# Patient Record
Sex: Male | Born: 1975 | Race: White | Hispanic: No | Marital: Single
Health system: Southern US, Community
[De-identification: ages and names within clinical notes are randomized; demographics above are authoritative.]

---

## 2008-07-26 ENCOUNTER — Emergency Department (HOSPITAL_COMMUNITY): Admission: EM | Admit: 2008-07-26 | Discharge: 2008-07-27 | Payer: Self-pay | Admitting: Emergency Medicine

## 2009-10-09 IMAGING — CR DG STERNUM 2+V
1 series · 1 of 1 positions shown · non-contrast
Comparison: Chest x-ray on same date

CLINICAL DATA: Lower sternal pain after injury

STERNUM - 2+ VIEW

[w sternum lat]
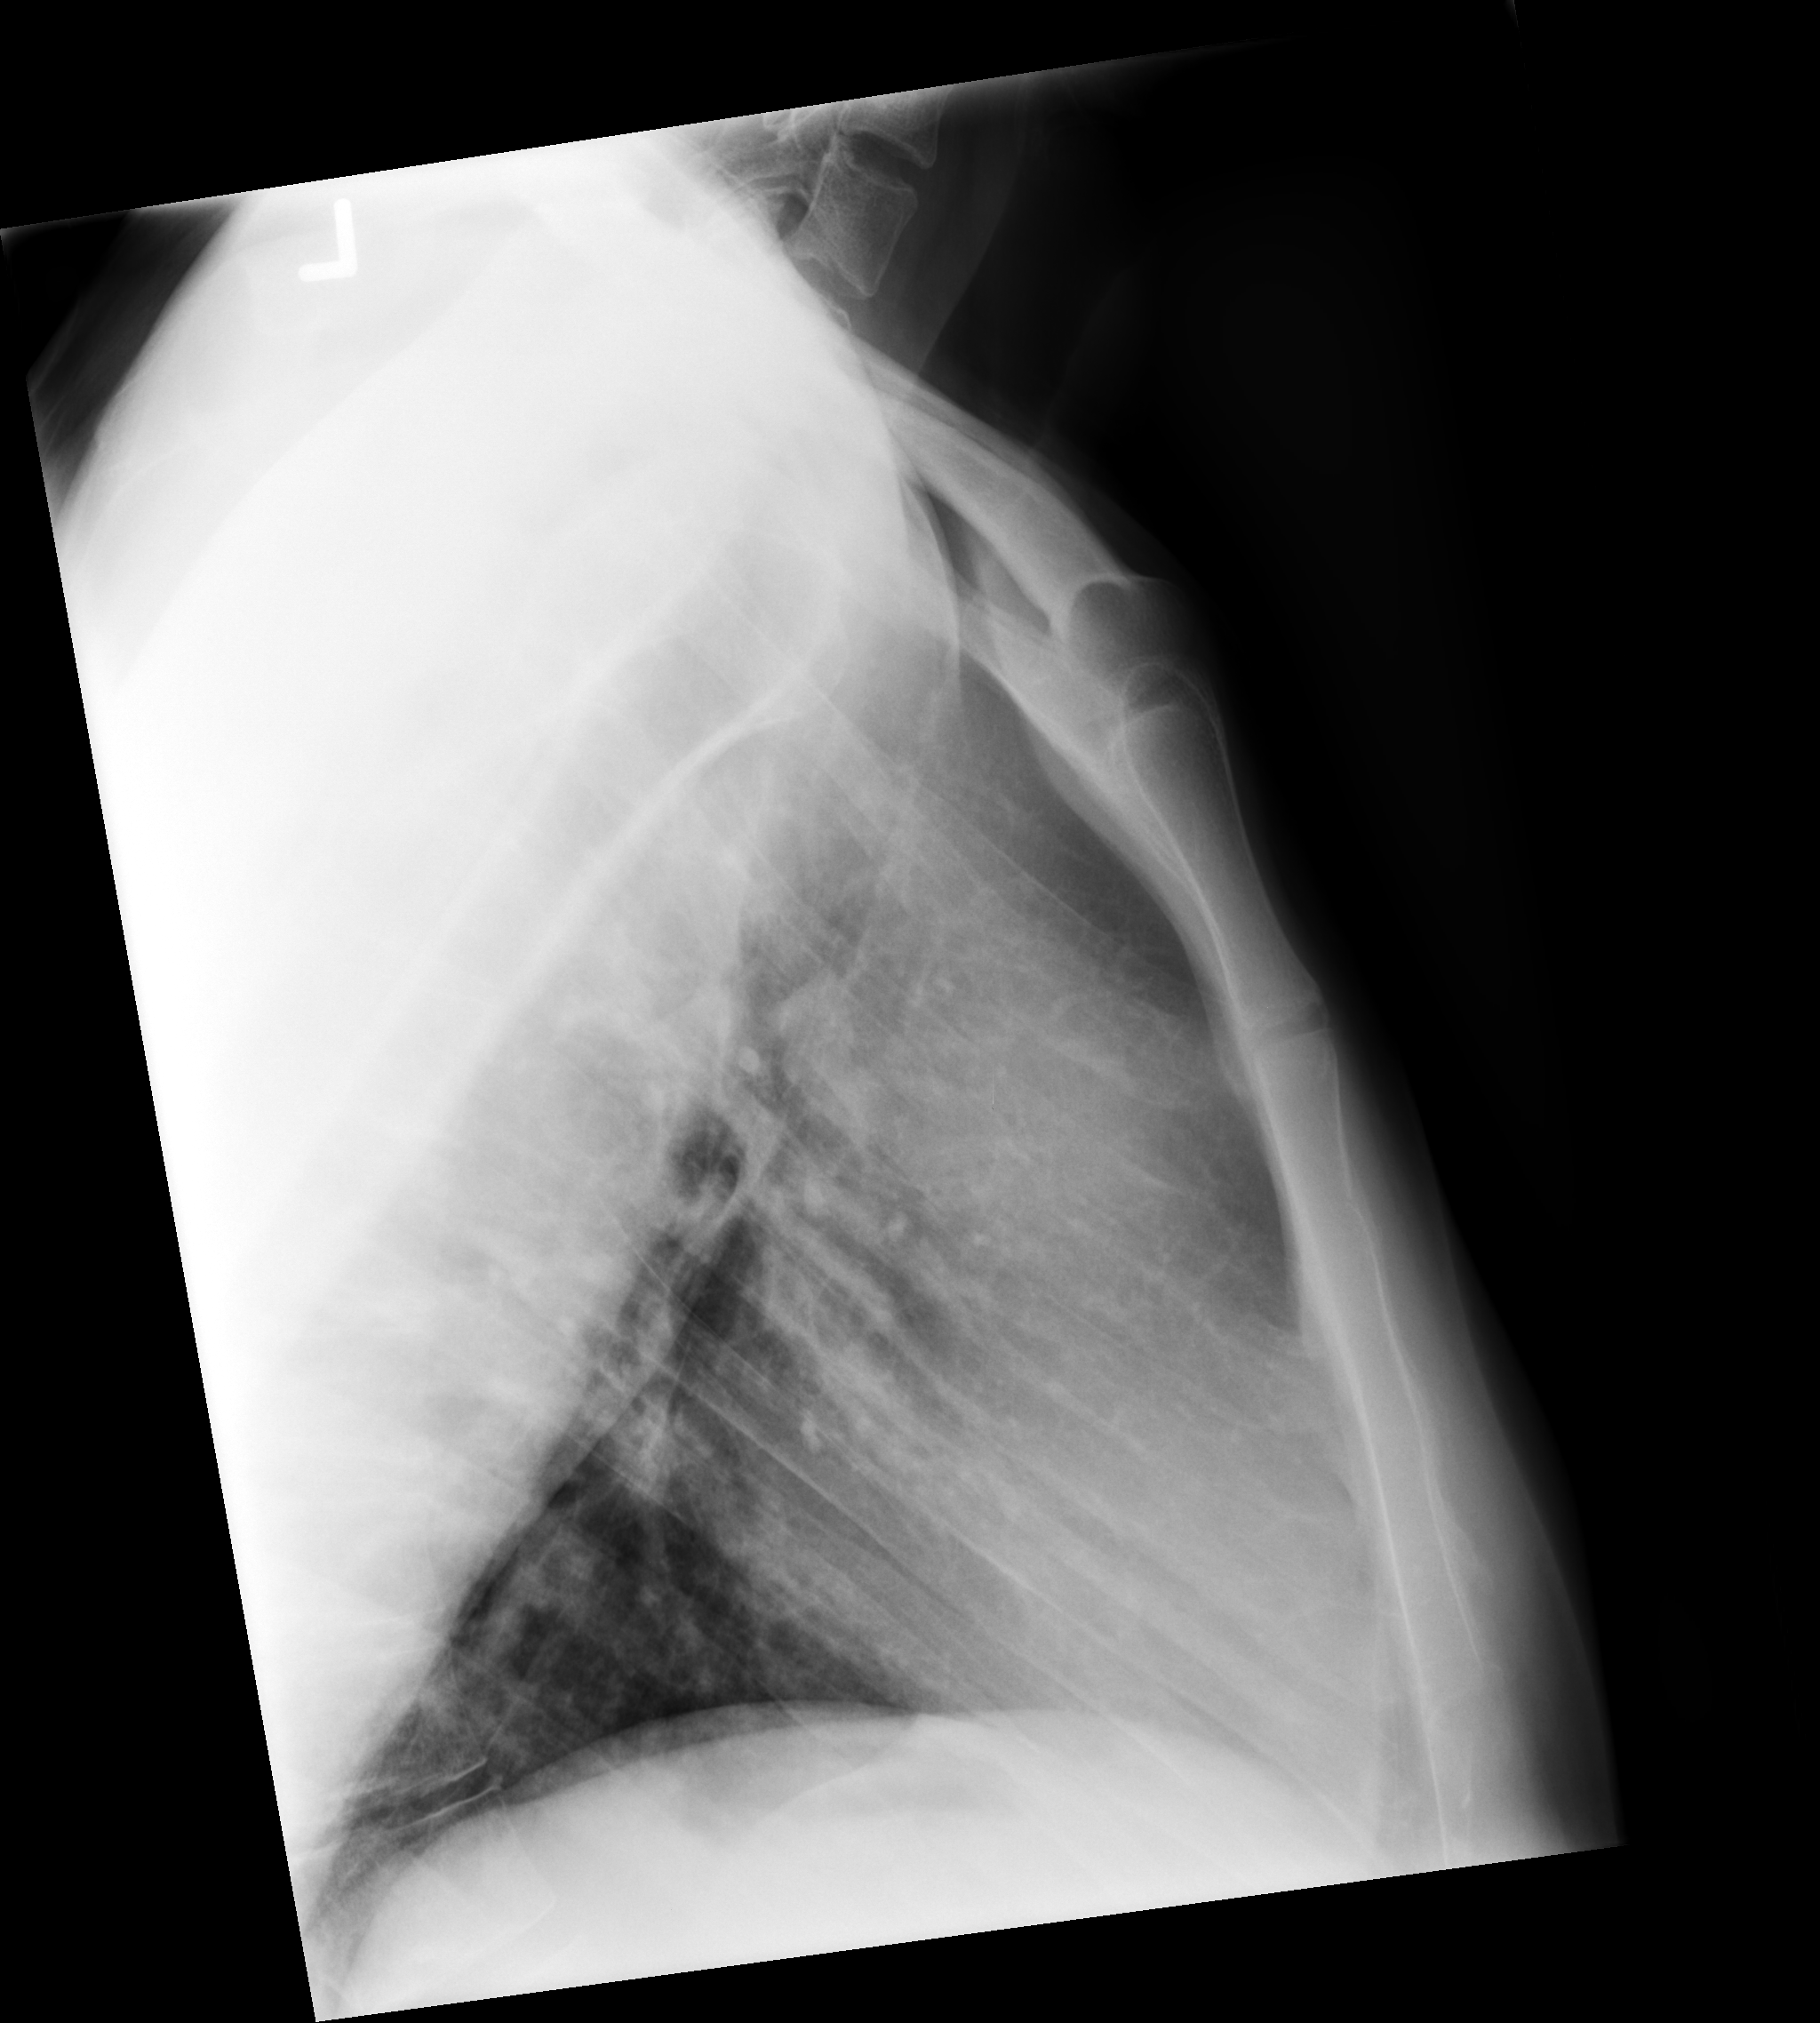

[1 of 1 positions shown; findings below may reference images not displayed]

FINDINGS: There is mild irregularity involving the contour of the
distal sternum and xyphoid.  This does not have a convincing
appearance for acute fracture.  The rest of the sternum and
manubrium are normal in appearance.
IMPRESSION: No convincing sternal fracture identified.  Some irregularity of
the distal sternum present which likely does not represent acute
injury.

## 2016-12-25 ENCOUNTER — Encounter (HOSPITAL_COMMUNITY): Payer: Self-pay | Admitting: Emergency Medicine

## 2016-12-25 ENCOUNTER — Emergency Department (HOSPITAL_COMMUNITY)
Admission: EM | Admit: 2016-12-25 | Discharge: 2016-12-26 | Disposition: A | Discharge status: Home / Self Care | Payer: Self-pay | Attending: Emergency Medicine | Admitting: Emergency Medicine

## 2016-12-25 DIAGNOSIS — R202 Paresthesia of skin: Secondary | ICD-10-CM | POA: Insufficient documentation

## 2016-12-25 DIAGNOSIS — Z79899 Other long term (current) drug therapy: Secondary | ICD-10-CM | POA: Insufficient documentation

## 2016-12-25 LAB — CBC
HCT: 48.2 % (ref 39.0–52.0)
Hemoglobin: 16.8 g/dL (ref 13.0–17.0)
MCH: 30.8 pg (ref 26.0–34.0)
MCHC: 34.9 g/dL (ref 30.0–36.0)
MCV: 88.3 fL (ref 78.0–100.0)
PLATELETS: 272 10*3/uL (ref 150–400)
RBC: 5.46 MIL/uL (ref 4.22–5.81)
RDW: 13.3 % (ref 11.5–15.5)
WBC: 11.1 10*3/uL — ABNORMAL HIGH (ref 4.0–10.5)

## 2016-12-25 LAB — BASIC METABOLIC PANEL
Anion gap: 8 (ref 5–15)
BUN: 15 mg/dL (ref 6–20)
CALCIUM: 10 mg/dL (ref 8.9–10.3)
CO2: 27 mmol/L (ref 22–32)
CREATININE: 0.95 mg/dL (ref 0.61–1.24)
Chloride: 102 mmol/L (ref 101–111)
GFR calc Af Amer: >60 mL/min (ref >60)
GFR calc non Af Amer: >60 mL/min (ref >60)
GLUCOSE: 89 mg/dL (ref 65–99)
Potassium: 3.9 mmol/L (ref 3.5–5.1)
Sodium: 137 mmol/L (ref 135–145)

## 2016-12-25 MED ORDER — LABETALOL HCL 5 MG/ML IV SOLN
20.0000 mg | Freq: Once | INTRAVENOUS | Status: AC
  Administered 2016-12-25: 20 mg via INTRAVENOUS
  Filled 2016-12-25: qty 4

## 2016-12-25 NOTE — ED Notes (Signed)
No improvement in s/s as BP comes down

## 2016-12-25 NOTE — ED Triage Notes (Signed)
Pt reports since last night he started having right sided numbness to right leg and back side of right arm. Pt states since last last when he walks he feels like his gait has been "off'. Pt is alert and ox4. No weakness, no speech changes, no other neuro deficits.

## 2016-12-25 NOTE — ED Provider Notes (Signed)
[facility] EMERGENCY DEPARTMENT Provider Note   CSN: [id number] Arrival date & time: 12/25/16  1602     History   Chief Complaint Chief Complaint  Patient presents with  . Numbness    HPI Kyle Robbins is a 41 y.o. male.  Patient presents to the ED with a chief complaint of numbness and tingling.  He states that last night he noticed stocking/glove numbness and tingling in his bilateral lower extremities and right upper extremity.  He denies any symptoms in his left upper arm.  He states that he felt a little off balance.  He denies any vision changes, speech changes, or weakness.  He denies any fevers, chills, n/v/d, CP, SOB, bowel or bladder incontinence.  He states that he had these symptoms about a week ago as well, but they resolved.  He denies any new medication changes.  Denies any recent drug or alcohol use.  There are no modifying factors.  He denies any injuries.  He reports some neck pain when questioned about this specifically, but doesn't associate it with his current symptoms.  He is also concerned that his BP is very elevated for him.   The history is provided by the patient. No language interpreter was used.    History reviewed. No pertinent past medical history.  There are no active problems to display for this patient.   History reviewed. No pertinent surgical history.     Home Medications    Prior to Admission medications   Medication Sig Start Date End Date Taking? Authorizing Provider  amphetamine-dextroamphetamine (ADDERALL) 20 MG tablet Take 20 mg by mouth 3 (three) times daily.   Yes [provider]  clonazePAM (KLONOPIN) 2 MG tablet Take 2 mg by mouth 3 (three) times daily as needed for anxiety.   Yes [provider]  FLUoxetine (PROZAC) 40 MG capsule Take 40 mg by mouth at bedtime.   Yes [provider]  ibuprofen (ADVIL,MOTRIN) 200 MG tablet Take 600-800 mg by mouth daily as needed for headache (pain).    Yes [provider]  naproxen sodium (ALEVE) 220 MG tablet Take 440 mg by mouth daily as needed (pain/headache).   Yes [provider]    Family History No family history on file.  Social History Social History  Substance Use Topics  . Smoking status: Not on file  . Smokeless tobacco: Not on file  . Alcohol use Not on file     Allergies   Patient has no known allergies.   Review of Systems Review of Systems  All other systems reviewed and are negative.    Physical Exam Updated Vital Signs BP (!) 158/108 (BP Location: Left Arm)   Pulse 86   Temp 98.3 F (36.8 C) (Oral)   Resp 18   Ht  (1.676 m)   Wt 69.4 kg (153 lb)   SpO2 96%   BMI 24.69 kg/m   Physical Exam  Constitutional: He is oriented to person, place, and time. He appears well-developed and well-nourished.  HENT:  Head: Normocephalic and atraumatic.  Eyes: Pupils are equal, round, and reactive to light. Conjunctivae and EOM are normal. Right eye exhibits no discharge. Left eye exhibits no discharge. No scleral icterus.  Neck: Normal range of motion. Neck supple. No JVD present.  Cardiovascular: Normal rate, regular rhythm and normal heart sounds.  Exam reveals no gallop and no friction rub.   No murmur heard. Pulmonary/Chest: Effort normal and breath sounds normal. No respiratory distress.  He has no wheezes. He has no rales. He exhibits no tenderness.  Abdominal: Soft. He exhibits no distension and no mass. There is no tenderness. There is no rebound and no guarding.  Musculoskeletal: Normal range of motion. He exhibits no edema or tenderness.  ROM and strength intact 5/5 throughout No CTLS spine tenderness  Neurological: He is alert and oriented to person, place, and time.  Sensation intact throughout Speech is clear Movements are goal oriented Normal reflexes throughout CN 3-12 intact Ambulates appropriately  Skin: Skin is warm and dry.  No evidence of rash or infection    Psychiatric: He has a normal mood and affect. His behavior is normal. Judgment and thought content normal.  Nursing note and vitals reviewed.    ED Treatments / Results  Labs (all labs ordered are listed, but only abnormal results are displayed) Labs Reviewed  CBC - Abnormal; Notable for the following:       Result Value   WBC 11.1 (*)    All other components within normal limits  RAPID URINE DRUG SCREEN, HOSP PERFORMED - Abnormal; Notable for the following:    Amphetamines POSITIVE (*)    Tetrahydrocannabinol POSITIVE (*)    All other components within normal limits  BASIC METABOLIC PANEL  TSH  ETHANOL  HEPATIC FUNCTION PANEL  HIV ANTIBODY (ROUTINE TESTING)    EKG  EKG Interpretation None       Radiology No results found.  Procedures Procedures (including critical care time)  Medications Ordered in ED Medications - No data to display   Initial Impression / Assessment and Plan / ED Course  I have reviewed the triage vital signs and the nursing notes.  Pertinent labs & imaging results that were available during my care of the patient were reviewed by me and considered in my medical decision making (see chart for details).     Patient with numbness and tingling to bilateral feet and right hand. Symptoms have been intermittent for a week or so. He denies any slurred speech, vision changes, headache doubt stroke or intracranial process. Denies any weakness, doubt GBS.  Patient has intact pulses and brisk capillary refill. No hx of DM.  TSH is normal. LFTs normal.  Normal strength, normal reflexes. Laboratory workup is unrevealing for acute process. Will recommend outpatient neurology follow-up.  Patient understands and agrees with the plan.  Final Clinical Impressions(s) / ED Diagnoses   Final diagnoses:  Paresthesia    New Prescriptions New Prescriptions   No medications on file     Roxy Horseman, Cordelia Poche 12/26/16 0222    Shon Baton,  MD 12/26/16 [phone]

## 2016-12-26 LAB — RAPID URINE DRUG SCREEN, HOSP PERFORMED
Amphetamines: POSITIVE — AB
Barbiturates: NOT DETECTED
Benzodiazepines: NOT DETECTED
Cocaine: NOT DETECTED
Opiates: NOT DETECTED
Tetrahydrocannabinol: POSITIVE — AB

## 2016-12-26 LAB — HEPATIC FUNCTION PANEL
ALBUMIN: 4.8 g/dL (ref 3.5–5.0)
ALK PHOS: 63 U/L (ref 38–126)
ALT: 24 U/L (ref 17–63)
AST: 24 U/L (ref 15–41)
Bilirubin, Direct: 0.1 mg/dL (ref 0.1–0.5)
Indirect Bilirubin: 0.5 mg/dL (ref 0.3–0.9)
TOTAL PROTEIN: 7.6 g/dL (ref 6.5–8.1)
Total Bilirubin: 0.6 mg/dL (ref 0.3–1.2)

## 2016-12-26 LAB — TSH: TSH: 2.316 u[IU]/mL (ref 0.350–4.500)

## 2016-12-26 LAB — ETHANOL

## 2016-12-26 LAB — HIV ANTIBODY (ROUTINE TESTING W REFLEX): HIV Screen 4th Generation wRfx: NONREACTIVE

## 2016-12-26 NOTE — ED Notes (Signed)
Pt left at this time with all belongings.  

## 2016-12-26 NOTE — ED Notes (Signed)
PA at bedside.
